# Patient Record
Sex: Male | Born: 1995 | Race: White | Hispanic: No | Marital: Single
Health system: Southern US, Community
[De-identification: ages and names within clinical notes are randomized; demographics above are authoritative.]

---
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
## 2015-11-21 ENCOUNTER — Other Ambulatory Visit: Payer: Self-pay | Admitting: Pulmonary Disease

## 2015-11-21 ENCOUNTER — Ambulatory Visit
Admission: EM | Admit: 2015-11-21 | Discharge: 2015-11-21 | Disposition: A | Discharge status: Home / Self Care | Payer: Medicaid Other | Attending: Pulmonary Disease | Admitting: Pulmonary Disease

## 2015-11-21 ENCOUNTER — Encounter: Payer: Self-pay | Admitting: *Deleted

## 2015-11-21 ENCOUNTER — Ambulatory Visit
Admission: RE | Admit: 2015-11-21 | Discharge: 2015-11-21 | Disposition: A | Discharge status: Home / Self Care | Payer: Medicaid Other | Source: Ambulatory Visit | Attending: Pulmonary Disease | Admitting: Pulmonary Disease

## 2015-11-21 ENCOUNTER — Emergency Department
Admission: EM | Admit: 2015-11-21 | Discharge: 2015-11-21 | Disposition: A | Discharge status: Home / Self Care | Payer: Medicaid Other | Attending: Emergency Medicine | Admitting: Emergency Medicine

## 2015-11-21 DIAGNOSIS — R079 Chest pain, unspecified: Secondary | ICD-10-CM | POA: Insufficient documentation

## 2015-11-21 DIAGNOSIS — R109 Unspecified abdominal pain: Secondary | ICD-10-CM | POA: Diagnosis not present

## 2015-11-21 DIAGNOSIS — M7918 Myalgia, other site: Secondary | ICD-10-CM

## 2015-11-21 DIAGNOSIS — M549 Dorsalgia, unspecified: Secondary | ICD-10-CM | POA: Diagnosis not present

## 2015-11-21 LAB — URINALYSIS COMPLETE WITH MICROSCOPIC (ARMC ONLY)
BACTERIA UA: NONE SEEN
Bilirubin Urine: NEGATIVE
Glucose, UA: NEGATIVE mg/dL
Hgb urine dipstick: NEGATIVE
KETONES UR: NEGATIVE mg/dL
Leukocytes, UA: NEGATIVE
Nitrite: NEGATIVE
PH: 6 (ref 5.0–8.0)
PROTEIN: NEGATIVE mg/dL
SQUAMOUS EPITHELIAL / LPF: NONE SEEN
Specific Gravity, Urine: 1.025 (ref 1.005–1.030)

## 2015-11-21 MED ORDER — TRAMADOL HCL 50 MG PO TABS
50.0000 mg | ORAL_TABLET | Freq: Four times a day (QID) | ORAL | Status: AC | PRN
Start: 2015-11-21 — End: 2015-11-24

## 2015-11-21 MED ORDER — KETOROLAC TROMETHAMINE 30 MG/ML IJ SOLN
60.0000 mg | Freq: Once | INTRAMUSCULAR | Status: AC
  Administered 2015-11-21: 60 mg via INTRAMUSCULAR
  Filled 2015-11-21: qty 2

## 2015-11-21 NOTE — ED Notes (Signed)
Pt c/o of back pain rated at 8 out of 10 described as sharp/pounding in the right lateral back. Pt also c/o SOB. Pain increases with movement.

## 2015-11-21 NOTE — ED Notes (Signed)
MD Quigley at bedside. 

## 2015-11-21 NOTE — ED Notes (Addendum)
States right mid back pain for 2 days with some SOB, states pain is beginning to spread to the middle of his back, awake and alert in no acute distress, was sent for a chest xray this AM by  peds but they never heard back with results

## 2015-11-21 NOTE — ED Notes (Signed)
Discussed discharge instructions, prescriptions, and follow-up care with patient and mother, with pt's permission. No questions or concerns at this time. Pt stable at discharge.

## 2015-11-21 NOTE — ED Notes (Signed)
Spoke to Dr. Cyril Loosen concerning pt and triage protocols, per MD no protocols initiated, pt put in waiting room

## 2015-11-26 NOTE — ED Provider Notes (Signed)
Time Seen: Approximately *1900  I have reviewed the triage notes  Chief Complaint: Back Pain   History of Present Illness: Derrick Cole is a 20 y.o. male *who presented with some right-sided flank pain and pleuritic discomfort. Patient had a chest x-ray performed earlier at [facility] and did not have the results. Patient states his pains worse with movement. Denies any dysuria, hematuria, urinary frequency. No fever or chills or productive cough. Patient has some pleuritic component and denies any risk factors for pulmonary embolism such as a history of blood clots, recent travel, etc. Patient denies any obvious trauma History reviewed. No pertinent past medical history.  There are no active problems to display for this patient.   No past surgical history on file.  No past surgical history on file.  No current outpatient prescriptions on file.  Allergies:  Review of patient's allergies indicates no known allergies.  Family History: History reviewed. No pertinent family history.  Social History: Social History  Substance Use Topics  . Smoking status: None  . Smokeless tobacco: None  . Alcohol Use: None     Review of Systems:   10 point review of systems was performed and was otherwise negative:  Constitutional: No fever Eyes: No visual disturbances ENT: No sore throat, ear pain Cardiac: No chest pain Respiratory: No shortness of breath, wheezing, or stridor Abdomen: No abdominal pain, no vomiting, No diarrhea Endocrine: No weight loss, No night sweats Extremities: No peripheral edema, cyanosis Skin: No rashes, easy bruising Neurologic: No focal weakness, trouble with speech or swollowing Urologic: No dysuria, Hematuria, or urinary frequency   Physical Exam:  ED Triage Vitals  Enc Vitals Group     BP 11/21/15 1834 155/81 mmHg     Pulse Rate 11/21/15 1834 78     Resp 11/21/15 1834 18     Temp 11/21/15 1834 98.1 F (36.7 C)     Temp Source  11/21/15 1834 Oral     SpO2 11/21/15 1834 100 %     Weight 11/21/15 1834 250 lb (113.399 kg)     Height 11/21/15 1834  (1.803 m)     Head Cir --      Peak Flow --      Pain Score 11/21/15 1835 9     Pain Loc --      Pain Edu? --      Excl. in GC? --     General: Awake , Alert , and Oriented times 3; GCS 15 Head: Normal cephalic , atraumatic Eyes: Pupils equal , round, reactive to light Nose/Throat: No nasal drainage, patent upper airway without erythema or exudate.  Neck: Supple, Full range of motion, No anterior adenopathy or palpable thyroid masses Lungs: Clear to ascultation without wheezes , rhonchi, or rales Heart: Regular rate, regular rhythm without murmurs , gallops , or rubs Abdomen: Soft, non tender without rebound, guarding , or rigidity; bowel sounds positive and symmetric in all 4 quadrants. No organomegaly .        Extremities: 2 plus symmetric pulses. No edema, clubbing or cyanosis Neurologic: normal ambulation, Motor symmetric without deficits, sensory intact Skin: warm, dry, no rashes Patient has reproducible pain with some palpation over the right latissimus dorsi area toward the right flank. There is no crepitus or step-off noted. Pain is reproducible with movement with no focal neurologic deficits.  Labs:   All laboratory work was reviewed including any pertinent negatives or positives listed below:  Labs Reviewed  URINALYSIS COMPLETEWITH  MICROSCOPIC ([facility] ONLY) - Abnormal; Notable for the following:    Color, Urine YELLOW (*)    APPearance CLEAR (*)    All other components within normal limits   urine was reviewed which showed no abnormalities    Radiology:   Chest x-ray results were reviewed which did not show any abnormalities   I personally reviewed the radiologic studies     ED Course:  The patient's pain seems to be very reproducible with movement and palpation I felt most likely this was musculoskeletal in nature. I don't suspect a  pulmonary embolism, kidney stone, community acquired pneumonia, etc. I felt blood work would be noncontributory to the patient's course at this time. Results of the exam and x-ray findings were reviewed with his mother was at the bedside. Patient was treated with generalized pain medication. 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    Assessment: Musculoskeletal right flank pain   Final Clinical Impression:  Final diagnoses:  None     Plan: Patient was advised to return immediately if condition worsens. Patient was advised to follow up with their primary care physician or other specialized physicians involved in their outpatient care Outpatient management            Jennye Moccasin, MD 11/26/15 [phone]

## 2017-08-27 IMAGING — CR DG CHEST 2V
1 series · 2 of 2 positions shown · non-contrast
Comparison: None.

CLINICAL DATA: 19-year-old male right chest and rib pain for 1 day.
No known injury.

EXAM:
CHEST  2 VIEW

[Series 1: dg chest 2 view · 0.14mm/px · 2 of 2 slices shown]
[im 1/2]
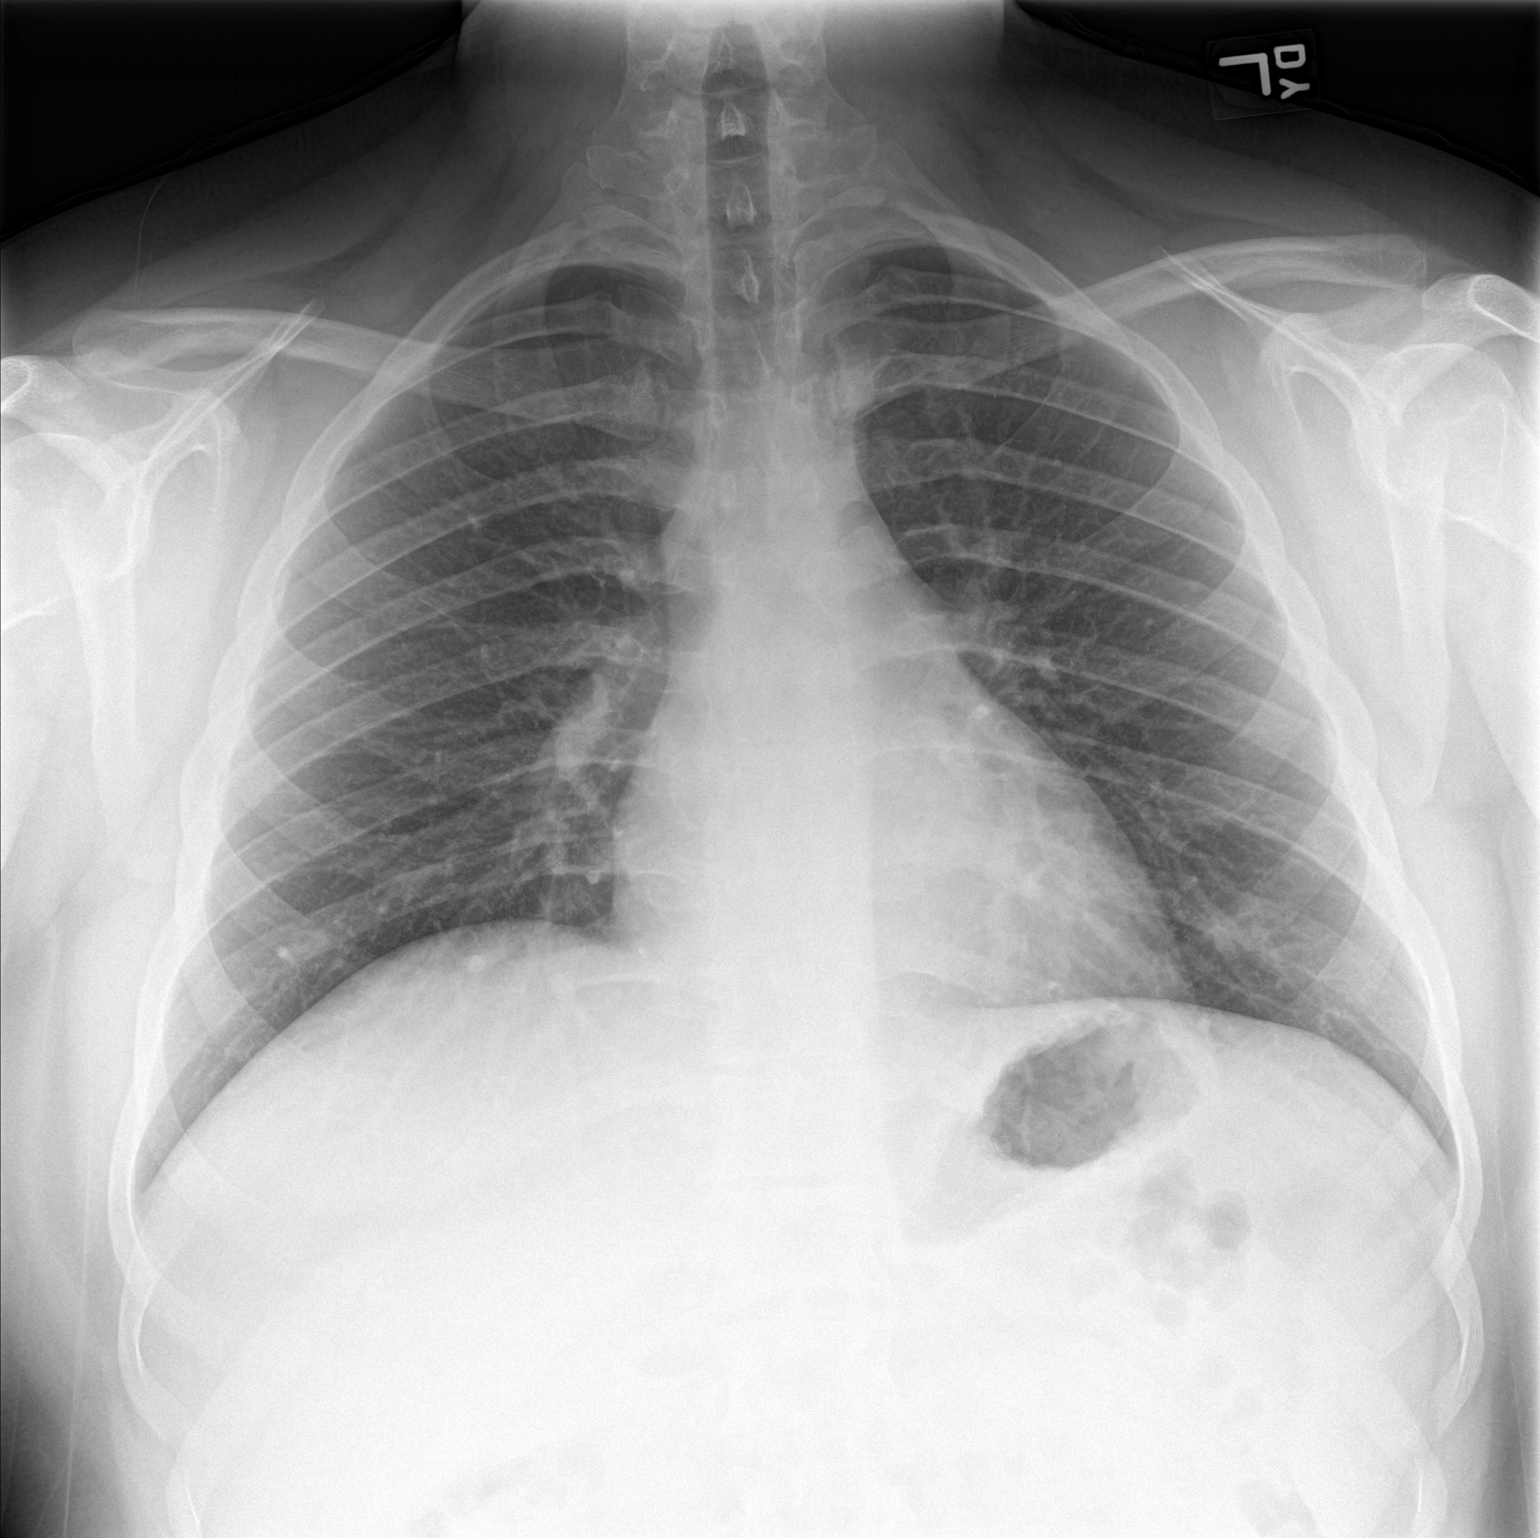
[im 2/2]
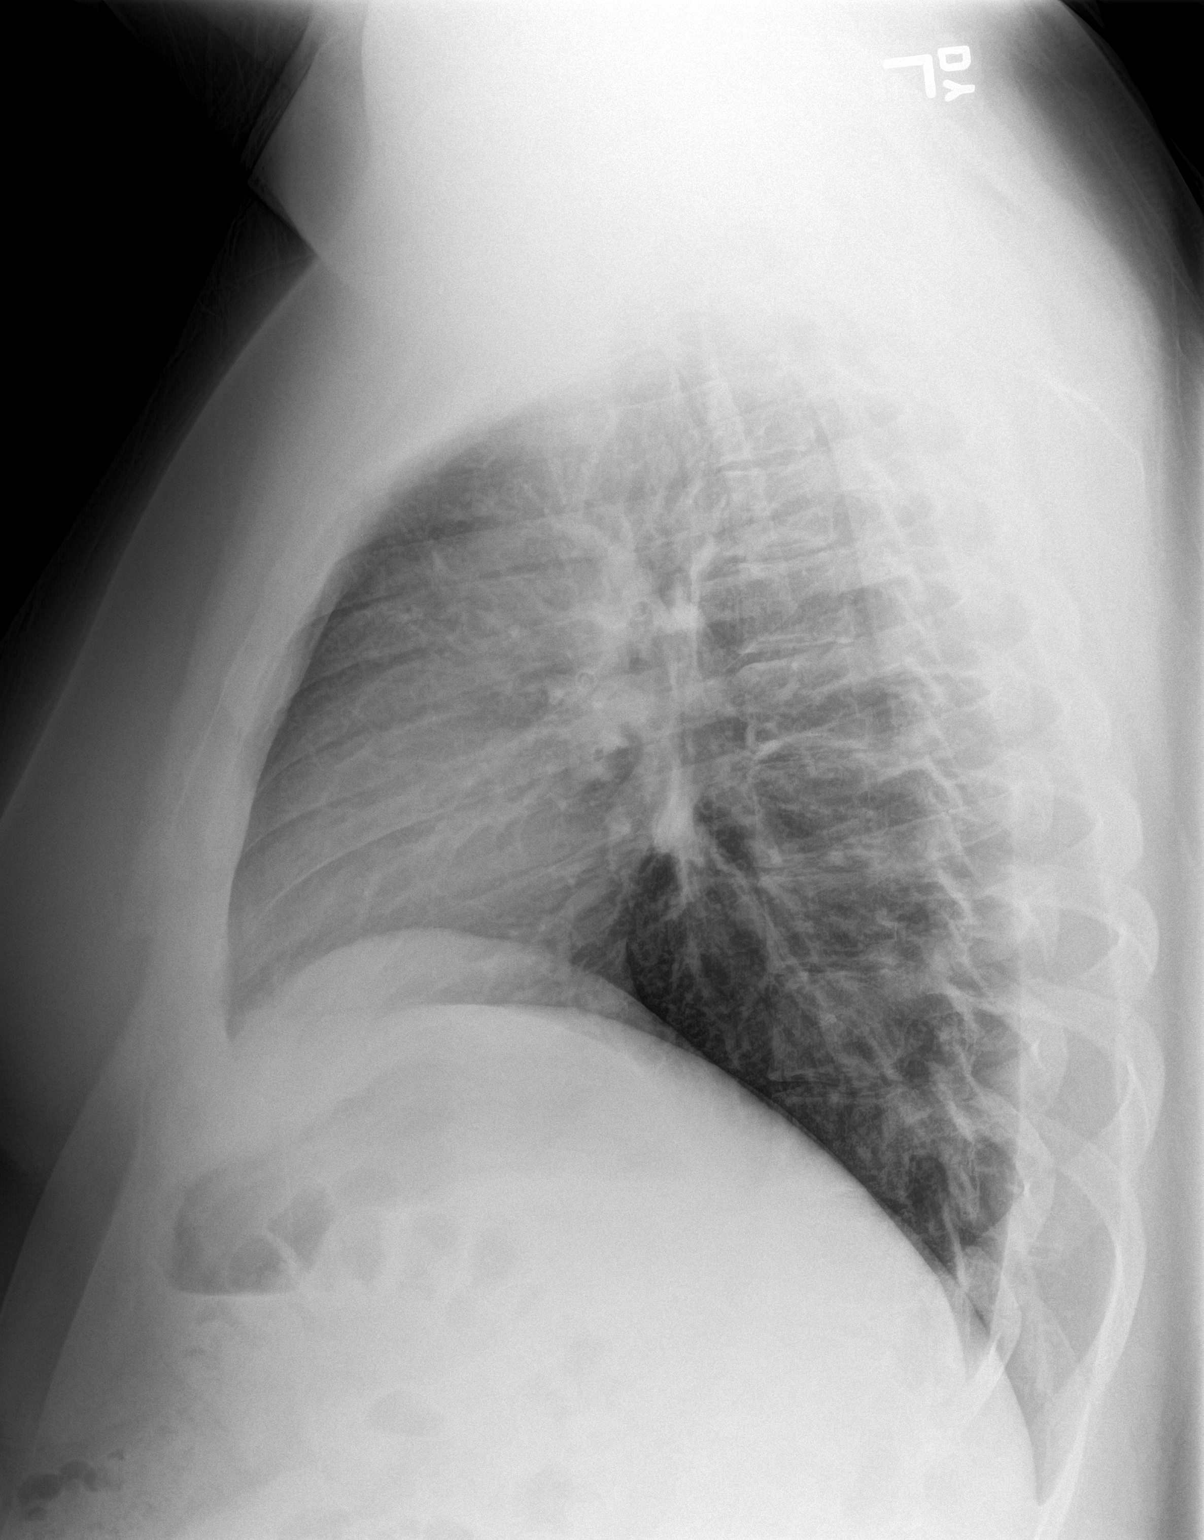

[2 of 2 positions shown; findings below may reference images not displayed]

FINDINGS: The cardiomediastinal silhouette is unremarkable.

There is no evidence of focal airspace disease, pulmonary edema,
suspicious pulmonary nodule/mass, pleural effusion, or pneumothorax.
No acute bony abnormalities are identified.
IMPRESSION: No active cardiopulmonary disease.
